# Patient Record
Sex: Male | Born: 1975
Health system: Southern US, Community
[De-identification: ages and names within clinical notes are randomized; demographics above are authoritative.]

## PROBLEM LIST (undated history)

## (undated) DIAGNOSIS — E7800 Pure hypercholesterolemia, unspecified: Secondary | ICD-10-CM

## (undated) DIAGNOSIS — I10 Essential (primary) hypertension: Secondary | ICD-10-CM

## (undated) DIAGNOSIS — L659 Nonscarring hair loss, unspecified: Secondary | ICD-10-CM

---

## 2018-11-26 DIAGNOSIS — J019 Acute sinusitis, unspecified: Secondary | ICD-10-CM | POA: Diagnosis not present

## 2018-11-29 ENCOUNTER — Other Ambulatory Visit: Payer: Self-pay

## 2018-11-29 ENCOUNTER — Emergency Department (INDEPENDENT_AMBULATORY_CARE_PROVIDER_SITE_OTHER): Payer: Commercial Managed Care - PPO

## 2018-11-29 ENCOUNTER — Emergency Department
Admission: EM | Admit: 2018-11-29 | Discharge: 2018-11-29 | Disposition: A | Discharge status: Home / Self Care | Payer: Commercial Managed Care - PPO | Source: Home / Self Care

## 2018-11-29 ENCOUNTER — Encounter: Payer: Self-pay | Admitting: Emergency Medicine

## 2018-11-29 DIAGNOSIS — S82831A Other fracture of upper and lower end of right fibula, initial encounter for closed fracture: Secondary | ICD-10-CM | POA: Diagnosis not present

## 2018-11-29 DIAGNOSIS — S99911A Unspecified injury of right ankle, initial encounter: Secondary | ICD-10-CM

## 2018-11-29 DIAGNOSIS — S89391A Other physeal fracture of lower end of right fibula, initial encounter for closed fracture: Secondary | ICD-10-CM | POA: Diagnosis not present

## 2018-11-29 DIAGNOSIS — M79671 Pain in right foot: Secondary | ICD-10-CM | POA: Diagnosis not present

## 2018-11-29 DIAGNOSIS — W010XXA Fall on same level from slipping, tripping and stumbling without subsequent striking against object, initial encounter: Secondary | ICD-10-CM

## 2018-11-29 HISTORY — DX: Nonscarring hair loss, unspecified: L65.9

## 2018-11-29 HISTORY — DX: Pure hypercholesterolemia, unspecified: E78.00

## 2018-11-29 HISTORY — DX: Essential (primary) hypertension: I10

## 2018-11-29 MED ORDER — ACETAMINOPHEN-CODEINE #3 300-30 MG PO TABS: 1.0000 | ORAL_TABLET | Freq: Four times a day (QID) | ORAL | 0 refills | Status: DC | PRN

## 2018-11-29 NOTE — Discharge Instructions (Signed)
°  Acetaminophen-codeine (Tylenol #3) is a narcotic pain medication, do not combine these medications with others containing tylenol. While taking, do not drink alcohol, drive, or perform any other activities that requires focus while taking these medications.   Please call Sports Medicine office on Monday to schedule a follow up exam in 1-2 weeks for further evaluation and treatment of broken bone.   Try to stay off your Right foot as much as possible. You may remove the boot to elevate and apply a cool compress to your ankle 2-3 times daily to help with pain and swelling.

## 2018-11-29 NOTE — ED Provider Notes (Signed)
[facility]    CSN: [id number] Arrival date & time: 11/29/18  1735     History   Chief Complaint Chief Complaint  Patient presents with  . Ankle Pain    HPI Mario Haney is a 43 y.o. male.   HPI  Mario Haney is a 43 y.o. male presenting to UC with c/o gradually worsening Right ankle pain and swelling that occurred around 11:30AM while pt was playing paintball, pt rolled his ankle. Pt continued to play his game.  Pain worsened significantly after removing his shoes and socks. Pt states he cannot take NSAIDs due to a clotting issue. No prior injury to same ankle/foot. No other injuries.   Past Medical History:  Diagnosis Date  . Hair loss   . Hypercholesteremia   . Hypertension     There are no active problems to display for this patient.   History reviewed. No pertinent surgical history.     Home Medications    Prior to Admission medications   Medication Sig Start Date End Date Taking? Authorizing Provider  amoxicillin (AMOXIL) 875 MG tablet Take 875 mg by mouth 2 (two) times daily.   Yes [provider]  finasteride (PROSCAR) 5 MG tablet Take 5 mg by mouth daily.   Yes [provider]  lisinopril (PRINIVIL,ZESTRIL) 10 MG tablet Take 10 mg by mouth daily.   Yes [provider]  rosuvastatin (CRESTOR) 20 MG tablet Take 20 mg by mouth daily.   Yes [provider]  acetaminophen-codeine (TYLENOL #3) 300-30 MG tablet Take 1-2 tablets by mouth every 6 (six) hours as needed for moderate pain. 11/29/18   Lurene Shadow, PA-C    Family History History reviewed. No pertinent family history.  Social History Social History   Tobacco Use  . Smoking status: Never Smoker  . Smokeless tobacco: Current User  Substance Use Topics  . Alcohol use: Yes  . Drug use: Never     Allergies   Tramadol   Review of Systems Review of Systems  Musculoskeletal: Positive for arthralgias, joint swelling and myalgias.  Skin:  Positive for color change. Negative for wound.     Physical Exam Triage Vital Signs ED Triage Vitals  Enc Vitals Group     BP 11/29/18 1756 (!) 143/95     Pulse Rate 11/29/18 1756 99     Resp 11/29/18 1756 18     Temp 11/29/18 1756 99.6 F (37.6 C)     Temp Source 11/29/18 1756 Oral     SpO2 11/29/18 1756 97 %     Weight --      Height --      Head Circumference --      Peak Flow --      Pain Score 11/29/18 1757 3     Pain Loc --      Pain Edu? --      Excl. in GC? --    No data found.  Updated Vital Signs BP (!) 143/95 (BP Location: Right Arm)   Pulse 99   Temp 99.6 F (37.6 C) (Oral)   Resp 18   SpO2 97%   Visual Acuity Right Eye Distance:   Left Eye Distance:   Bilateral Distance:    Right Eye Near:   Left Eye Near:    Bilateral Near:     Physical Exam Vitals signs and nursing note reviewed.  Constitutional:      Appearance: Normal appearance. He is well-developed.  HENT:  Head: Normocephalic and atraumatic.  Neck:     Musculoskeletal: Normal range of motion.  Cardiovascular:     Rate and Rhythm: Normal rate.  Pulmonary:     Effort: Pulmonary effort is normal.  Musculoskeletal:        General: Swelling and tenderness present.     Comments: Moderate to significantly edema of ankle, worse on medial aspect. Slight decreased ROM. Tenderness to medial aspect and proximal dorsum of foot.  Calf is soft, non-tender.  Skin:    General: Skin is warm and dry.     Capillary Refill: Capillary refill takes less than 2 seconds.     Comments: Right ankle and foot: skin in tact. Ecchymosis to medial aspect of ankle.  Neurological:     Mental Status: He is alert and oriented to person, place, and time.  Psychiatric:        Behavior: Behavior normal.      UC Treatments / Results  Labs (all labs ordered are listed, but only abnormal results are displayed) Labs Reviewed - No data to display  EKG None  Radiology Dg Ankle Complete Right  Result Date:  11/29/2018 CLINICAL DATA:  Slipped, fall.  Right ankle injury. EXAM: RIGHT ANKLE - COMPLETE 3+ VIEW COMPARISON:  None. FINDINGS: There is lateral soft tissue swelling. Oblique fracture through the distal fibula, nondisplaced. No tibial abnormality. Joint spaces are maintained. IMPRESSION: Oblique nondisplaced fracture through the distal fibular metaphysis. Electronically Signed   By: Charlett NoseKevin  Dover M.D.   On: 11/29/2018 18:19   Dg Foot Complete Right  Result Date: 11/29/2018 CLINICAL DATA:  Fall, right ankle injury, pain EXAM: RIGHT FOOT COMPLETE - 3+ VIEW COMPARISON:  None. FINDINGS: The distal fibular fracture is better seen on the ankle series. No acute bony abnormality within the right foot. Joint spaces maintained. IMPRESSION: No acute bony abnormality within the right foot. Nondisplaced distal fibular fracture, better seen on today's ankle series. Electronically Signed   By: Charlett NoseKevin  Dover M.D.   On: 11/29/2018 18:19    Procedures Procedures (including critical care time)  Medications Ordered in UC Medications - No data to display  Initial Impression / Assessment and Plan / UC Course  I have reviewed the triage vital signs and the nursing notes.  Pertinent labs & imaging results that were available during my care of the patient were reviewed by me and considered in my medical decision making (see chart for details).     Reviewed imaging with pt Placed in him a walking boot, crutches provided AVS given  Final Clinical Impressions(s) / UC Diagnoses   Final diagnoses:  Right foot pain  Right ankle injury, initial encounter  Other closed fracture of distal end of right fibula, initial encounter     Discharge Instructions      Acetaminophen-codeine (Tylenol #3) is a narcotic pain medication, do not combine these medications with others containing tylenol. While taking, do not drink alcohol, drive, or perform any other activities that requires focus while taking these medications.    Please call Sports Medicine office on Monday to schedule a follow up exam in 1-2 weeks for further evaluation and treatment of broken bone.   Try to stay off your Right foot as much as possible. You may remove the boot to elevate and apply a cool compress to your ankle 2-3 times daily to help with pain and swelling.    ED Prescriptions    Medication Sig Dispense Auth. Provider   acetaminophen-codeine (TYLENOL #3) 300-30 MG tablet  Take 1-2 tablets by mouth every 6 (six) hours as needed for moderate pain. 15 tablet Lurene Shadow, PA-C     Controlled Substance Prescriptions [place] Controlled Substance Registry consulted? Yes, I have consulted the [place] Controlled Substances Registry for this patient, and feel the risk/benefit ratio today is favorable for proceeding with this prescription for a controlled substance.   Lurene Shadow, [place] 11/30/18 1230

## 2018-11-29 NOTE — ED Triage Notes (Signed)
The patient presented to the [facility] with a complaint of right ankle pain secondary to a fall that occurred earlier today while playing paintball.

## 2018-12-01 ENCOUNTER — Ambulatory Visit (INDEPENDENT_AMBULATORY_CARE_PROVIDER_SITE_OTHER): Payer: Commercial Managed Care - PPO | Admitting: Sports Medicine

## 2018-12-01 ENCOUNTER — Encounter: Payer: Self-pay | Admitting: Sports Medicine

## 2018-12-01 DIAGNOSIS — S82444A Nondisplaced spiral fracture of shaft of right fibula, initial encounter for closed fracture: Secondary | ICD-10-CM | POA: Diagnosis not present

## 2018-12-01 DIAGNOSIS — S82401A Unspecified fracture of shaft of right fibula, initial encounter for closed fracture: Secondary | ICD-10-CM | POA: Insufficient documentation

## 2018-12-01 NOTE — Progress Notes (Signed)
Subjective:    CC: Right ankle injury  HPI:  A few days ago this pleasant 43 year old male was Paintballing, he took a misstep and inverted his right ankle.  He had immediate pain, swelling, bruising and inability to bear weight.  He was seen in urgent care where x-rays showed a spiral fracture through the fibula.  Foot x-rays were unremarkable.  He was appropriately placed in a boot, crutches and referred to me for further evaluation and definitive treatment.  I reviewed the past medical history, family history, social history, surgical history, and allergies today and no changes were needed.  Please see the problem list section below in epic for further details.  Past Medical History: Past Medical History:  Diagnosis Date  . Hair loss   . Hypercholesteremia   . Hypertension    Past Surgical History: No past surgical history on file. Social History: Social History   Socioeconomic History  . Marital status: Unknown    Spouse name: Not on file  . Number of children: Not on file  . Years of education: Not on file  . Highest education level: Not on file  Occupational History  . Not on file  Social Needs  . Financial resource strain: Not on file  . Food insecurity:    Worry: Not on file    Inability: Not on file  . Transportation needs:    Medical: Not on file    Non-medical: Not on file  Tobacco Use  . Smoking status: Never Smoker  . Smokeless tobacco: Current User  Substance and Sexual Activity  . Alcohol use: Yes  . Drug use: Never  . Sexual activity: Never  Lifestyle  . Physical activity:    Days per week: Not on file    Minutes per session: Not on file  . Stress: Not on file  Relationships  . Social connections:    Talks on phone: Not on file    Gets together: Not on file    Attends religious service: Not on file    Active member of club or organization: Not on file    Attends meetings of clubs or organizations: Not on file    Relationship status: Not on  file  Other Topics Concern  . Not on file  Social History Narrative  . Not on file   Family History: No family history on file. Allergies: Allergies  Allergen Reactions  . Tramadol Other (See Comments)    Patient stated that it makes him aggressive. He has had codeine before and did well, he was not aggressive with the codeine   Medications: See med rec.  Review of Systems: No headache, visual changes, nausea, vomiting, diarrhea, constipation, dizziness, abdominal pain, skin rash, fevers, chills, night sweats, swollen lymph nodes, weight loss, chest pain, body aches, joint swelling, muscle aches, shortness of breath, mood changes, visual or auditory hallucinations.  Objective:    General: Well Developed, well nourished, and in no acute distress.  Neuro: Alert and oriented x3, extra-ocular muscles intact, sensation grossly intact.  HEENT: Normocephalic, atraumatic, pupils equal round reactive to light, neck supple, no masses, no lymphadenopathy, thyroid nonpalpable.  Skin: Warm and dry, no rashes noted.  Cardiac: Regular rate and rhythm, no murmurs rubs or gallops.  Respiratory: Clear to auscultation bilaterally. Not using accessory muscles, speaking in full sentences.  Abdominal: Soft, nontender, nondistended, positive bowel sounds, no masses, no organomegaly.  Right ankle: Bruised and swollen over the lateral ankle, severely swollen foot. Range of motion is full  in all directions. Strength is 5/5 in all directions. Stable lateral and medial ligaments; squeeze test and kleiger test unremarkable; Talar dome nontender; No pain at base of 5th MT; No tenderness over cuboid; No tenderness over N spot or navicular prominence No tenderness on posterior aspects of lateral and medial malleolus No sign of peroneal tendon subluxations; Negative tarsal tunnel tinel's Unable to bear weight.  X-rays personally reviewed, spiral nondisplaced fracture through the fibular shaft, no fractures  over the foot.  Impression and Recommendations:    The patient was counselled, risk factors were discussed, anticipatory guidance given.  Fracture of fibula, right, closed Nondisplaced spiral fracture of the fibular shaft. Foot was strapped with compressive dressing, it was very swollen. Continue cam boot. Elevate feet at work. Return to see me in 2 weeks, x-ray before visit. ___________________________________________ Ihor Austin. Benjamin Stain, M.D., ABFM., CAQSM. Primary Care and Sports Medicine [facility]  Adjunct Professor of Family Medicine  [employer]

## 2018-12-01 NOTE — Assessment & Plan Note (Signed)
Nondisplaced spiral fracture of the fibular shaft. Foot was strapped with compressive dressing, it was very swollen. Continue cam boot. Elevate feet at work. Return to see me in 2 weeks, x-ray before visit.

## 2018-12-03 ENCOUNTER — Encounter: Payer: Self-pay | Admitting: Sports Medicine

## 2018-12-11 ENCOUNTER — Encounter: Payer: Self-pay | Admitting: Sports Medicine

## 2018-12-11 MED ORDER — ACETAMINOPHEN-CODEINE #3 300-30 MG PO TABS: 1.0000 | ORAL_TABLET | Freq: Three times a day (TID) | ORAL | 0 refills | Status: DC | PRN

## 2018-12-15 ENCOUNTER — Ambulatory Visit (INDEPENDENT_AMBULATORY_CARE_PROVIDER_SITE_OTHER): Payer: Commercial Managed Care - PPO

## 2018-12-15 ENCOUNTER — Encounter: Payer: Self-pay | Admitting: Sports Medicine

## 2018-12-15 ENCOUNTER — Ambulatory Visit: Payer: Commercial Managed Care - PPO | Admitting: Sports Medicine

## 2018-12-15 DIAGNOSIS — S82444D Nondisplaced spiral fracture of shaft of right fibula, subsequent encounter for closed fracture with routine healing: Secondary | ICD-10-CM | POA: Diagnosis not present

## 2018-12-15 DIAGNOSIS — S82431D Displaced oblique fracture of shaft of right fibula, subsequent encounter for closed fracture with routine healing: Secondary | ICD-10-CM | POA: Diagnosis not present

## 2018-12-15 DIAGNOSIS — S82444A Nondisplaced spiral fracture of shaft of right fibula, initial encounter for closed fracture: Secondary | ICD-10-CM

## 2018-12-15 DIAGNOSIS — S82831A Other fracture of upper and lower end of right fibula, initial encounter for closed fracture: Secondary | ICD-10-CM | POA: Diagnosis not present

## 2018-12-15 DIAGNOSIS — X58XXXD Exposure to other specified factors, subsequent encounter: Secondary | ICD-10-CM

## 2018-12-15 DIAGNOSIS — S92344A Nondisplaced fracture of fourth metatarsal bone, right foot, initial encounter for closed fracture: Secondary | ICD-10-CM

## 2018-12-15 NOTE — Progress Notes (Addendum)
Subjective:    CC: Recheck ankle fracture  HPI: This is a pleasant 43 year old male, 2 weeks ago he had an injury, ended up with a spiral fibular fracture.  He has been in the boot since, doing well.  He does have persistent severe pain over the dorsum of his metatarsals.  Symptoms are moderate, persistent, localized without radiation.  Significant persistent swelling.  I reviewed the past medical history, family history, social history, surgical history, and allergies today and no changes were needed.  Please see the problem list section below in epic for further details.  Past Medical History: Past Medical History:  Diagnosis Date  . Hair loss   . Hypercholesteremia   . Hypertension    Past Surgical History: No past surgical history on file. Social History: Social History   Socioeconomic History  . Marital status: Married    Spouse name: Not on file  . Number of children: Not on file  . Years of education: Not on file  . Highest education level: Not on file  Occupational History  . Not on file  Social Needs  . Financial resource strain: Not on file  . Food insecurity:    Worry: Not on file    Inability: Not on file  . Transportation needs:    Medical: Not on file    Non-medical: Not on file  Tobacco Use  . Smoking status: Never Smoker  . Smokeless tobacco: Current User  Substance and Sexual Activity  . Alcohol use: Yes  . Drug use: Never  . Sexual activity: Never  Lifestyle  . Physical activity:    Days per week: Not on file    Minutes per session: Not on file  . Stress: Not on file  Relationships  . Social connections:    Talks on phone: Not on file    Gets together: Not on file    Attends religious service: Not on file    Active member of club or organization: Not on file    Attends meetings of clubs or organizations: Not on file    Relationship status: Not on file  Other Topics Concern  . Not on file  Social History Narrative  . Not on file    Family History: No family history on file. Allergies: Allergies  Allergen Reactions  . Tramadol Other (See Comments)    Patient stated that it makes him aggressive. He has had codeine before and did well, he was not aggressive with the codeine   Medications: See med rec.  Review of Systems: No fevers, chills, night sweats, weight loss, chest pain, or shortness of breath.   Objective:    General: Well Developed, well nourished, and in no acute distress.  Neuro: Alert and oriented x3, extra-ocular muscles intact, sensation grossly intact.  HEENT: Normocephalic, atraumatic, pupils equal round reactive to light, neck supple, no masses, no lymphadenopathy, thyroid nonpalpable.  Skin: Warm and dry, no rashes. Cardiac: Regular rate and rhythm, no murmurs rubs or gallops, no lower extremity edema.  Respiratory: Clear to auscultation bilaterally. Not using accessory muscles, speaking in full sentences. Right ankle: Moderate swelling over the dorsum of the midfoot and forefoot. Range of motion is full in all directions. Strength is 5/5 in all directions. Stable lateral and medial ligaments; squeeze test and kleiger test unremarkable; Talar dome nontender; No pain at base of 5th MT; No tenderness over cuboid; No tenderness over N spot or navicular prominence Mild tenderness over the fibular shaft, much improved compared to prior  visit. No sign of peroneal tendon subluxations; Negative tarsal tunnel tinel's Able to walk 4 steps. Severe tenderness over the dorsum of the metatarsals.  Impression and Recommendations:    Fracture of fibula, right, closed 2 weeks post fracture, x-rays are unchanged. Still with minimal tenderness at the fracture site there is Expect 6 more week symptoms, continue nonweightbearing. He does have significant pain over the metatarsal shafts, I would like another foot x-ray so I did not see any metatarsal or phalangeal fractures on the initial  x-rays.   Fracture of fourth metatarsal bone of right foot A nondisplaced fourth metatarsal fracture is now visible on the x-rays.  This was not seen on the initial foot x-rays. Continue the boot for now.  No change in plan.   ___________________________________________ Ihor Austin. Benjamin Stain, M.D., ABFM., CAQSM. Primary Care and Sports Medicine [facility]  Adjunct Professor of Family Medicine  [employer]

## 2018-12-15 NOTE — Assessment & Plan Note (Signed)
2 weeks post fracture, x-rays are unchanged. Still with minimal tenderness at the fracture site there is Expect 6 more week symptoms, continue nonweightbearing. He does have significant pain over the metatarsal shafts, I would like another foot x-ray so I did not see any metatarsal or phalangeal fractures on the initial x-rays.

## 2018-12-16 DIAGNOSIS — S92341A Displaced fracture of fourth metatarsal bone, right foot, initial encounter for closed fracture: Secondary | ICD-10-CM | POA: Insufficient documentation

## 2018-12-16 NOTE — Assessment & Plan Note (Signed)
A nondisplaced fourth metatarsal fracture is now visible on the x-rays.  This was not seen on the initial foot x-rays. Continue the boot for now.  No change in plan.

## 2019-01-12 ENCOUNTER — Telehealth (INDEPENDENT_AMBULATORY_CARE_PROVIDER_SITE_OTHER): Payer: Commercial Managed Care - PPO | Admitting: Sports Medicine

## 2019-01-12 DIAGNOSIS — X58XXXD Exposure to other specified factors, subsequent encounter: Secondary | ICD-10-CM

## 2019-01-12 DIAGNOSIS — S92344D Nondisplaced fracture of fourth metatarsal bone, right foot, subsequent encounter for fracture with routine healing: Secondary | ICD-10-CM

## 2019-01-12 DIAGNOSIS — S82444D Nondisplaced spiral fracture of shaft of right fibula, subsequent encounter for closed fracture with routine healing: Secondary | ICD-10-CM

## 2019-01-12 NOTE — Assessment & Plan Note (Signed)
6 weeks post fourth metatarsal fracture, per patient he is pain-free to palpation and with walking. He can transition into just an ASO, and activities of daily living for the next month, return as needed.

## 2019-01-12 NOTE — Progress Notes (Signed)
Virtual Visit via WebEx/MyChart   I connected with  Lagregory Hori  on 01/12/19 via WebEx/MyChart and verified that I am speaking with the correct person using two identifiers.   I discussed the limitations, risks, security and privacy concerns of performing an evaluation and management service by WebEx/MyChart , including the higher likelihood of inaccurate diagnosis and treatment, and the availability of in person appointments.  We also discussed the likely need of an additional face to face encounter for complete and high quality delivery of care.  I also discussed with the patient that there may be a patient responsible charge related to this service. The patient expressed understanding and wishes to proceed.  Subjective:    CC: Recheck fracture  HPI: Aaronn is 6 weeks post fractures of the fibula and fourth metatarsal, he has been in a boot.  Lately he has been ambulating without a boot, only an ASO, he does not need any more pain medications, he tells me he has no tenderness over the fracture of the fibula or the metatarsal, and is happy with how things are going.  I reviewed the past medical history, family history, social history, surgical history, and allergies today and no changes were needed.  Please see the problem list section below in epic for further details.  Past Medical History: Past Medical History:  Diagnosis Date  . Hair loss   . Hypercholesteremia   . Hypertension    Past Surgical History: No past surgical history on file. Social History: Social History   Socioeconomic History  . Marital status: Married    Spouse name: Not on file  . Number of children: Not on file  . Years of education: Not on file  . Highest education level: Not on file  Occupational History  . Not on file  Social Needs  . Financial resource strain: Not on file  . Food insecurity:    Worry: Not on file    Inability: Not on file  . Transportation needs:    Medical: Not on file   Non-medical: Not on file  Tobacco Use  . Smoking status: Never Smoker  . Smokeless tobacco: Current User  Substance and Sexual Activity  . Alcohol use: Yes  . Drug use: Never  . Sexual activity: Never  Lifestyle  . Physical activity:    Days per week: Not on file    Minutes per session: Not on file  . Stress: Not on file  Relationships  . Social connections:    Talks on phone: Not on file    Gets together: Not on file    Attends religious service: Not on file    Active member of club or organization: Not on file    Attends meetings of clubs or organizations: Not on file    Relationship status: Not on file  Other Topics Concern  . Not on file  Social History Narrative  . Not on file   Family History: No family history on file. Allergies: Allergies  Allergen Reactions  . Tramadol Other (See Comments)    Patient stated that it makes him aggressive. He has had codeine before and did well, he was not aggressive with the codeine   Medications: See med rec.  Review of Systems: No fevers, chills, night sweats, weight loss, chest pain, or shortness of breath.   Objective:    General: Speaking full sentences, no audible heavy breathing.  Sounds alert and appropriately interactive.  Appears well.  Face symmetric.  Extraocular movements  intact.  Pupils equal and round.  No nasal flaring or accessory muscle use visualized.  No other physical exam performed due to the non-physical nature of this visit.  Impression and Recommendations:    Fracture of fibula, right, closed 6 weeks post fibular fracture, per patient he is pain-free to palpation and with walking. He can transition into just an ASO, and activities of daily living for the next month, return as needed.  Fracture of fourth metatarsal bone of right foot 6 weeks post fourth metatarsal fracture, per patient he is pain-free to palpation and with walking. He can transition into just an ASO, and activities of daily living for  the next month, return as needed.  I discussed the above assessment and treatment plan with the patient. The patient was provided an opportunity to ask questions and all were answered. The patient agreed with the plan and demonstrated an understanding of the instructions.   The patient was advised to call back or seek an in-person evaluation if the symptoms worsen or if the condition fails to improve as anticipated.   I provided 21 minutes of electronic video evaluation time during this encounter, less than 50% was time needed to gather information, review chart and records, explain the treatment plan to the patient, and complete documentation.   ___________________________________________ Ihor Austin. Benjamin Stain, M.D., ABFM., CAQSM. Primary Care and Sports Medicine [facility]  Adjunct Professor of Family Medicine  [employer]

## 2019-01-12 NOTE — Assessment & Plan Note (Signed)
6 weeks post fibular fracture, per patient he is pain-free to palpation and with walking. He can transition into just an ASO, and activities of daily living for the next month, return as needed.

## 2019-07-29 ENCOUNTER — Other Ambulatory Visit: Payer: Self-pay

## 2019-07-29 ENCOUNTER — Ambulatory Visit (INDEPENDENT_AMBULATORY_CARE_PROVIDER_SITE_OTHER): Payer: Commercial Managed Care - PPO | Admitting: Sports Medicine

## 2019-07-29 ENCOUNTER — Encounter: Payer: Self-pay | Admitting: Sports Medicine

## 2019-07-29 ENCOUNTER — Ambulatory Visit (INDEPENDENT_AMBULATORY_CARE_PROVIDER_SITE_OTHER): Payer: Commercial Managed Care - PPO

## 2019-07-29 DIAGNOSIS — S82444P Nondisplaced spiral fracture of shaft of right fibula, subsequent encounter for closed fracture with malunion: Secondary | ICD-10-CM

## 2019-07-29 DIAGNOSIS — S92344D Nondisplaced fracture of fourth metatarsal bone, right foot, subsequent encounter for fracture with routine healing: Secondary | ICD-10-CM

## 2019-07-29 DIAGNOSIS — S82899A Other fracture of unspecified lower leg, initial encounter for closed fracture: Secondary | ICD-10-CM

## 2019-07-29 MED ORDER — MELOXICAM 15 MG PO TABS: ORAL_TABLET | ORAL | 3 refills | Status: AC

## 2019-07-29 NOTE — Progress Notes (Addendum)
Subjective:    CC: Follow-up  HPI: This is a pleasant 43 year old male, approximately 7 months ago we treated a right spiral fibular fracture and a fourth metatarsal base fracture, things improved, fracture alignment was good, and his pain improved.  Unfortunately he tells me he has continued to have swelling and pain in the ankle, laterally for 7 months.  It is mild.  Localized without radiation.  I reviewed the past medical history, family history, social history, surgical history, and allergies today and no changes were needed.  Please see the problem list section below in epic for further details.  Past Medical History: Past Medical History:  Diagnosis Date  . Hair loss   . Hypercholesteremia   . Hypertension    Past Surgical History: No past surgical history on file. Social History: Social History   Socioeconomic History  . Marital status: Married    Spouse name: Not on file  . Number of children: Not on file  . Years of education: Not on file  . Highest education level: Not on file  Occupational History  . Not on file  Social Needs  . Financial resource strain: Not on file  . Food insecurity    Worry: Not on file    Inability: Not on file  . Transportation needs    Medical: Not on file    Non-medical: Not on file  Tobacco Use  . Smoking status: Never Smoker  . Smokeless tobacco: Current User  Substance and Sexual Activity  . Alcohol use: Yes  . Drug use: Never  . Sexual activity: Never  Lifestyle  . Physical activity    Days per week: Not on file    Minutes per session: Not on file  . Stress: Not on file  Relationships  . Social Herbalist on phone: Not on file    Gets together: Not on file    Attends religious service: Not on file    Active member of club or organization: Not on file    Attends meetings of clubs or organizations: Not on file    Relationship status: Not on file  Other Topics Concern  . Not on file  Social History Narrative   . Not on file   Family History: No family history on file. Allergies: Allergies  Allergen Reactions  . Tramadol Other (See Comments)    Patient stated that it makes him aggressive. He has had codeine before and did well, he was not aggressive with the codeine   Medications: See med rec.  Review of Systems: No fevers, chills, night sweats, weight loss, chest pain, or shortness of breath.   Objective:    General: Well Developed, well nourished, and in no acute distress.  Neuro: Alert and oriented x3, extra-ocular muscles intact, sensation grossly intact.  HEENT: Normocephalic, atraumatic, pupils equal round reactive to light, neck supple, no masses, no lymphadenopathy, thyroid nonpalpable.  Skin: Warm and dry, no rashes. Cardiac: Regular rate and rhythm, no murmurs rubs or gallops, no lower extremity edema.  Respiratory: Clear to auscultation bilaterally. Not using accessory muscles, speaking in full sentences. Right ankle: Swollen around the tibiotalar joint Range of motion is full in all directions. Strength is 5/5 in all directions. Stable lateral and medial ligaments; squeeze test and kleiger test unremarkable; Talar dome nontender; No pain at base of 5th MT; No tenderness over cuboid; No tenderness over N spot or navicular prominence Tender to palpation at the distal fibula. No sign of peroneal tendon  subluxations; Negative tarsal tunnel tinel's Able to walk 4 steps.  Impression and Recommendations:    Fracture of fibula, right, closed Persistent pain and swelling approximately 7 months post fracture. My concern is for nonunion versus posttraumatic osteoarthritis of the ankle joint, adding x-rays, MRI, meloxicam. Follow-up will depend on MRI results.  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 Everardo Pacific for consideration of ORIF.  Fracture of fourth metatarsal bone of right foot Pain-free and resolved now. We will x-ray just to make sure.    ___________________________________________ Ihor Austin. Benjamin Stain, M.D., ABFM., CAQSM. Primary Care and Sports Medicine [facility]  Adjunct Professor of Family Medicine  [employer]

## 2019-07-29 NOTE — Assessment & Plan Note (Signed)
Pain-free and resolved now. We will x-ray just to make sure.

## 2019-07-29 NOTE — Assessment & Plan Note (Addendum)
Persistent pain and swelling approximately 7 months post fracture. My concern is for nonunion versus posttraumatic osteoarthritis of the ankle joint, adding x-rays, MRI, meloxicam. Follow-up will depend on MRI results.  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 Griffin Basil for consideration of ORIF.

## 2019-07-30 ENCOUNTER — Encounter: Payer: Self-pay | Admitting: Sports Medicine

## 2019-08-03 ENCOUNTER — Other Ambulatory Visit: Payer: Self-pay

## 2019-08-03 ENCOUNTER — Ambulatory Visit (INDEPENDENT_AMBULATORY_CARE_PROVIDER_SITE_OTHER): Payer: Commercial Managed Care - PPO

## 2019-08-03 DIAGNOSIS — S82444P Nondisplaced spiral fracture of shaft of right fibula, subsequent encounter for closed fracture with malunion: Secondary | ICD-10-CM

## 2019-08-03 DIAGNOSIS — S82899A Other fracture of unspecified lower leg, initial encounter for closed fracture: Secondary | ICD-10-CM | POA: Diagnosis not present

## 2019-08-04 NOTE — Addendum Note (Signed)
Addended by: Silverio Decamp on: 08/04/2019 01:26 PM   Modules accepted: Orders

## 2019-08-07 ENCOUNTER — Other Ambulatory Visit: Payer: Self-pay | Admitting: Orthopaedic Surgery

## 2019-08-07 DIAGNOSIS — M25571 Pain in right ankle and joints of right foot: Secondary | ICD-10-CM

## 2019-08-11 ENCOUNTER — Ambulatory Visit
Admission: RE | Admit: 2019-08-11 | Discharge: 2019-08-11 | Disposition: A | Discharge status: Home / Self Care | Payer: Commercial Managed Care - PPO | Source: Ambulatory Visit | Attending: Orthopaedic Surgery | Admitting: Orthopaedic Surgery

## 2019-08-11 DIAGNOSIS — M25571 Pain in right ankle and joints of right foot: Secondary | ICD-10-CM

## 2020-08-31 IMAGING — MR MR ANKLE*R* W/O CM
5 series · 40 of 40 positions shown · non-contrast
Comparison: Radiographs 07/29/2019

CLINICAL DATA: Persistent ankle pain.  History of fracture.

EXAM:
MRI OF THE RIGHT ANKLE WITHOUT CONTRAST
TECHNIQUE: Multiplanar, multisequence MR imaging of the ankle was performed. No
intravenous contrast was administered.

[Series 3: PD fat-sat · axial · 3.0mm · 0.70mm/px · z∈[-38,+101]mm · 9 of 43 slices shown]
[im 1/43]
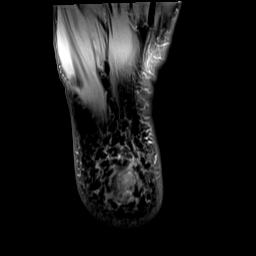
[im 6/43]
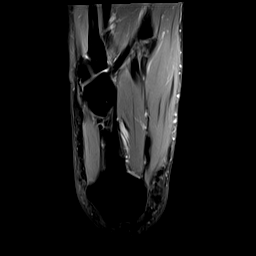
[im 11/43]
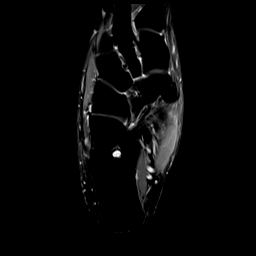
[im 16/43]
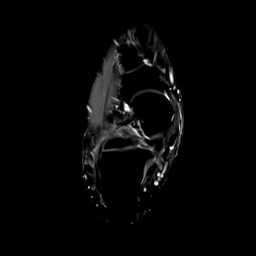
[im 22/43]
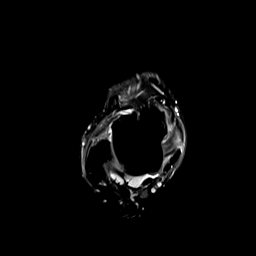
[im 27/43]
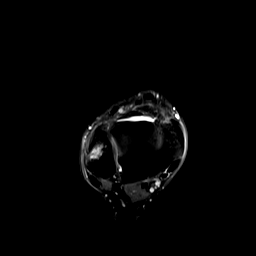
[im 32/43]
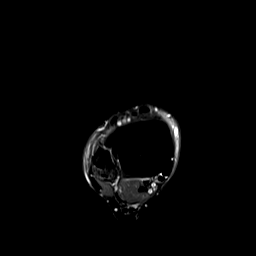
[im 37/43]
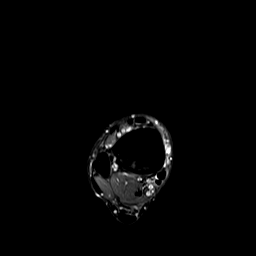
[im 43/43]
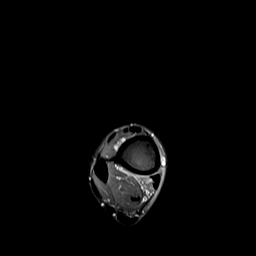

[Series 4: T2 fat-sat · axial · 3.0mm · 0.70mm/px · z∈[-38,+101]mm · 9 of 43 slices shown (1 of 2)]
[im 1/43]
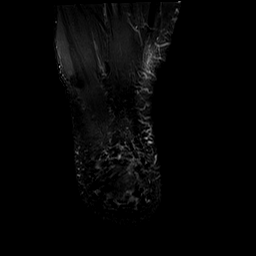
[im 6/43]
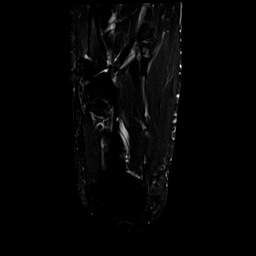
[im 11/43]
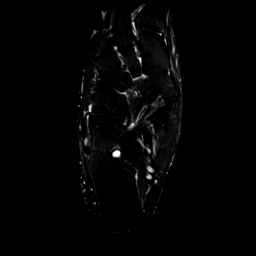
[im 16/43]
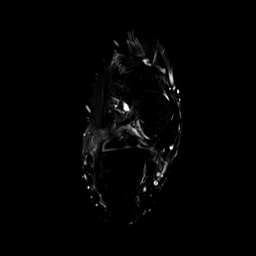
[im 22/43]
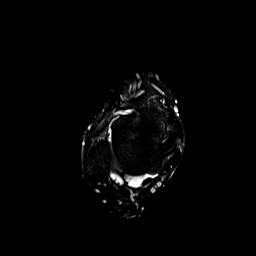
[im 27/43]
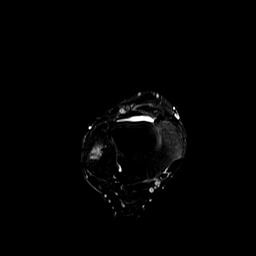
[im 32/43]
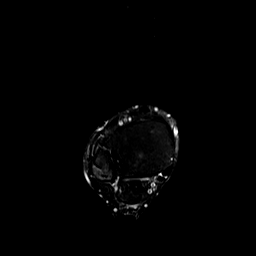
[im 37/43]
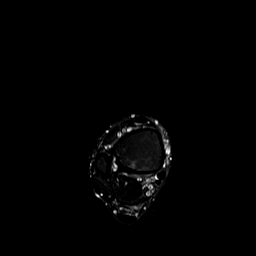
[im 43/43]
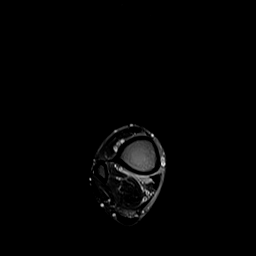

[Series 5: T2 fat-sat · coronal · 3.0mm · 0.70mm/px · 10 of 45 slices shown (2 of 2)]
[im 1/45]
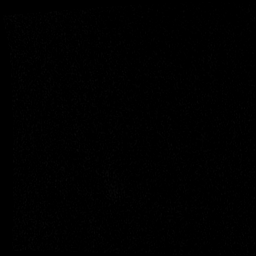
[im 5/45]
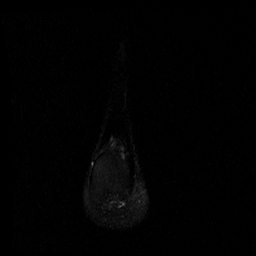
[im 10/45]
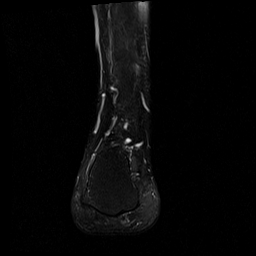
[im 15/45]
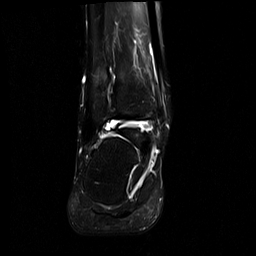
[im 20/45]
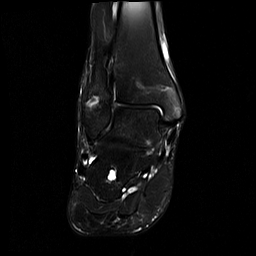
[im 25/45]
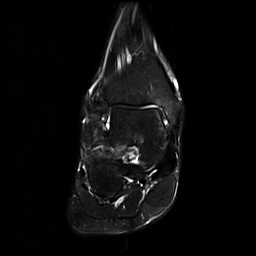
[im 30/45]
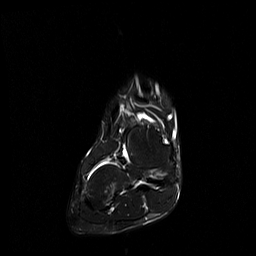
[im 35/45]
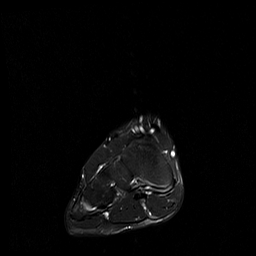
[im 40/45]
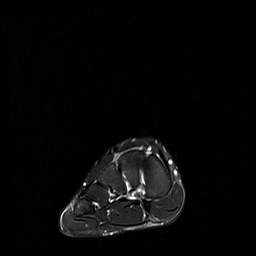
[im 45/45]
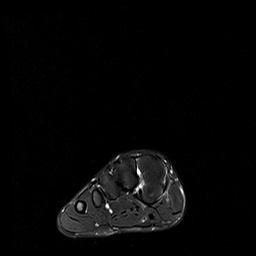

[Series 6: T1 · sagittal · 3.0mm · 0.56mm/px · 6 of 27 slices shown]
[im 1/27]
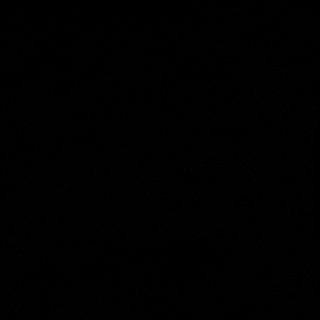
[im 6/27]
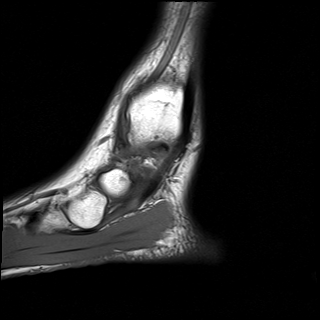
[im 11/27]
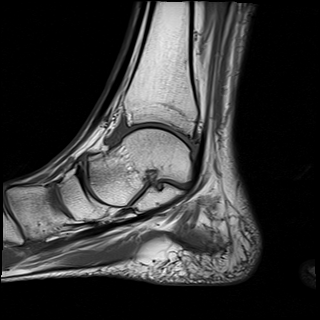
[im 16/27]
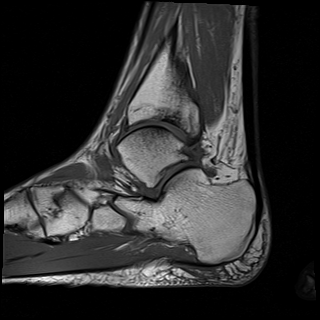
[im 21/27]
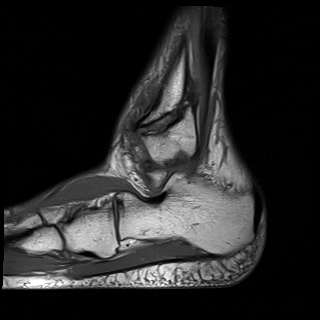
[im 27/27]
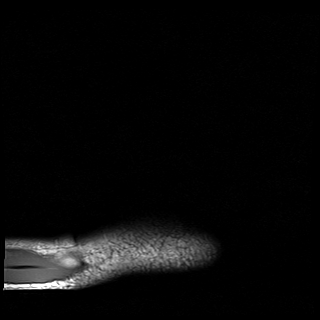

[Series 7: STIR · sagittal · 3.0mm · 0.70mm/px · 6 of 27 slices shown]
[im 1/27]
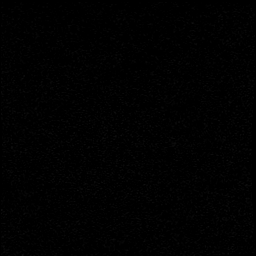
[im 6/27]
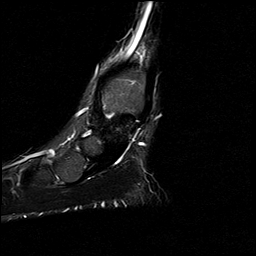
[im 11/27]
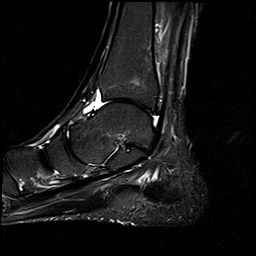
[im 16/27]
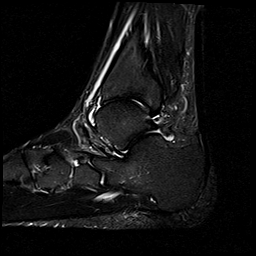
[im 21/27]
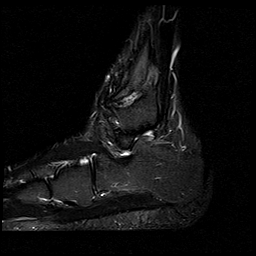
[im 27/27]
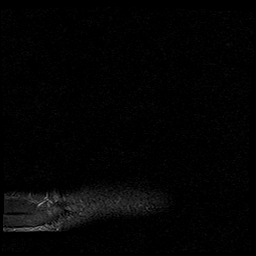

[40 of 40 positions shown; findings below may reference images not displayed]

FINDINGS: TENDONS

Peroneal: Intact

Posteromedial: Intact

Anterior: Intact

Achilles: Normal

Plantar Fascia: Intact

LIGAMENTS

Lateral: Intact

Medial: Intact

CARTILAGE

Ankle Joint: Moderate joint effusion. Mild degenerative changes. No
full-thickness cartilage defect or osteochondral lesion.

Subtalar Joints/Sinus Tarsi: The subtalar joints are maintained.
Mild degenerative changes. Sinus tarsi is unremarkable. The cervical
and interosseous ligaments are intact and the spring ligament is
intact. Incidental invagination of vascular tissue into the
calcaneus near the sinus tarsi which is a normal finding.

Bones: There is a oblique coursing fracture of the distal fibular
shaft at and above the level of the ankle mortise. The edges appear
corticated and I do not see any obvious healing changes. The
fracture gap is approximately 6 mm. CT may be helpful for further
evaluation of any osseous bridging. There is some posterosuperior
callus formation noted

Other: Normal foot and ankle musculature.
IMPRESSION: 1. The distal fibular fracture appears ununited. CT may be helpful
for further evaluation.
2. Mild tibiotalar degenerative changes, likely posttraumatic with
small to moderate-sized joint effusion.
3. Intact medial, lateral and anterior ankle tendons and medial and
lateral ankle ligaments.
